# Patient Record
Sex: Male | Born: 1990 | Race: White | Hispanic: No | Marital: Married | Smoking: Never smoker
Health system: Southern US, Community
[De-identification: ages and names within clinical notes are randomized; demographics above are authoritative.]

## PROBLEM LIST (undated history)

## (undated) ENCOUNTER — Emergency Department: Admission: EM | Payer: Medicaid Other | Source: Home / Self Care

---

## 2019-09-18 ENCOUNTER — Emergency Department: Payer: Medicaid Other

## 2019-09-18 ENCOUNTER — Other Ambulatory Visit: Payer: Self-pay

## 2019-09-18 ENCOUNTER — Encounter: Payer: Self-pay | Admitting: *Deleted

## 2019-09-18 ENCOUNTER — Emergency Department
Admission: EM | Admit: 2019-09-18 | Discharge: 2019-09-18 | Disposition: A | Discharge status: Home / Self Care | Payer: Medicaid Other | Attending: Student in an Organized Health Care Education/Training Program | Admitting: Student in an Organized Health Care Education/Training Program

## 2019-09-18 DIAGNOSIS — Z20828 Contact with and (suspected) exposure to other viral communicable diseases: Secondary | ICD-10-CM | POA: Diagnosis not present

## 2019-09-18 DIAGNOSIS — R05 Cough: Secondary | ICD-10-CM | POA: Insufficient documentation

## 2019-09-18 DIAGNOSIS — Z20822 Contact with and (suspected) exposure to covid-19: Secondary | ICD-10-CM

## 2019-09-18 IMAGING — DX DG CHEST 1V
1 series · 1 of 1 positions shown · non-contrast
Comparison: None

CLINICAL DATA: Cough, fever, body aches

EXAM:
CHEST  1 VIEW

[chest ap]
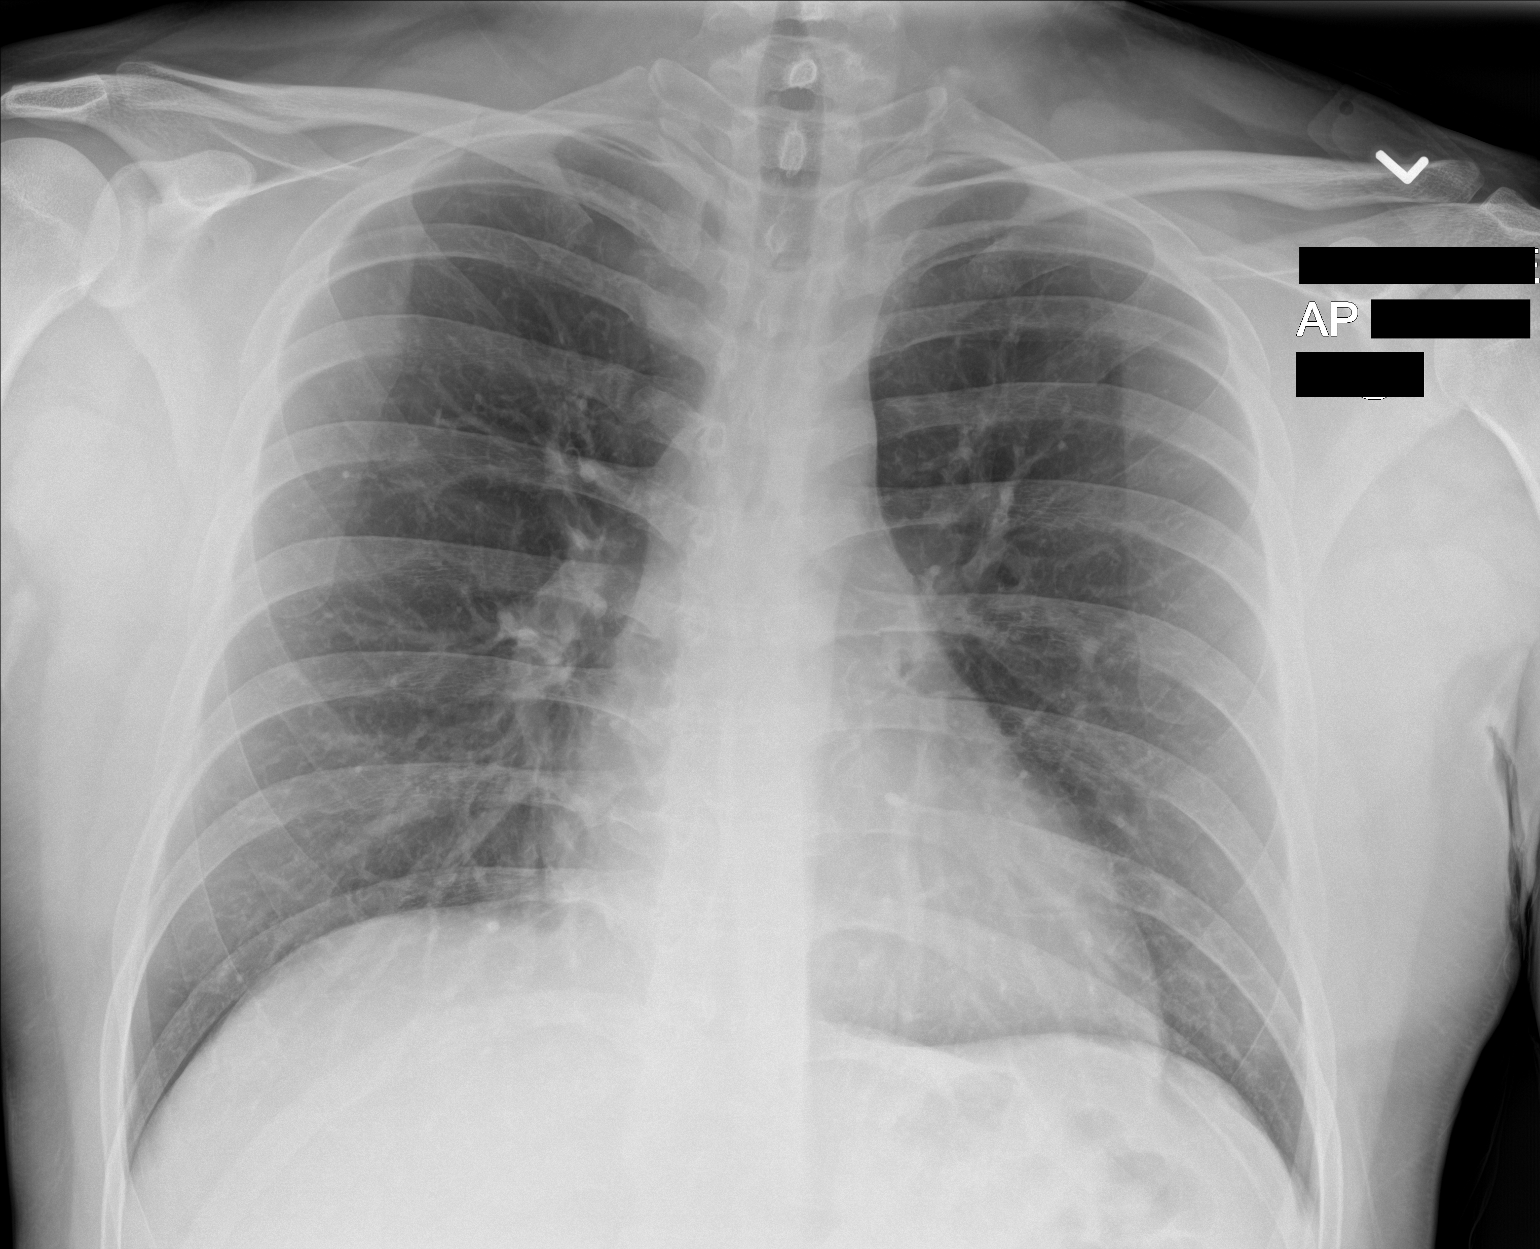

[1 of 1 positions shown; findings below may reference images not displayed]

FINDINGS: No consolidation, features of edema, pneumothorax, or effusion.
Pulmonary vascularity is normally distributed. The cardiomediastinal
contours are unremarkable. No acute osseous or soft tissue
abnormality.
IMPRESSION: No acute cardiopulmonary abnormality.

## 2019-09-18 MED ORDER — PREDNISONE 20 MG PO TABS
60.0000 mg | ORAL_TABLET | Freq: Once | ORAL | Status: AC
  Administered 2019-09-18: 60 mg via ORAL
  Filled 2019-09-18: qty 3

## 2019-09-18 MED ORDER — ALBUTEROL SULFATE HFA 108 (90 BASE) MCG/ACT IN AERS: 2.0000 | INHALATION_SPRAY | RESPIRATORY_TRACT | 0 refills | Status: DC | PRN

## 2019-09-18 MED ORDER — PREDNISONE 50 MG PO TABS: 50.0000 mg | ORAL_TABLET | Freq: Every day | ORAL | 0 refills | Status: DC

## 2019-09-18 MED ORDER — ALBUTEROL SULFATE (2.5 MG/3ML) 0.083% IN NEBU
2.5000 mg | INHALATION_SOLUTION | Freq: Once | RESPIRATORY_TRACT | Status: AC
  Administered 2019-09-18: 2.5 mg via RESPIRATORY_TRACT
  Filled 2019-09-18: qty 3

## 2019-09-18 MED ORDER — HYDROCODONE-HOMATROPINE 5-1.5 MG/5ML PO SYRP: 5.0000 mL | ORAL_SOLUTION | Freq: Four times a day (QID) | ORAL | 0 refills | Status: DC | PRN

## 2019-09-18 MED ORDER — BENZONATATE 100 MG PO CAPS: 100.0000 mg | ORAL_CAPSULE | Freq: Four times a day (QID) | ORAL | 0 refills | Status: DC | PRN

## 2019-09-18 MED ORDER — ONDANSETRON 4 MG PO TBDP: 4.0000 mg | ORAL_TABLET | Freq: Three times a day (TID) | ORAL | 0 refills | Status: DC | PRN

## 2019-09-18 MED ORDER — BENZONATATE 100 MG PO CAPS
100.0000 mg | ORAL_CAPSULE | Freq: Once | ORAL | Status: AC
  Administered 2019-09-18: 100 mg via ORAL
  Filled 2019-09-18: qty 1

## 2019-09-18 NOTE — ED Notes (Signed)
E-signature not working at this time. Pt verbalized understanding of D/C instructions, prescriptions and follow up care with no further questions at this time. Pt in NAD and ambulatory at time of D/C.  

## 2019-09-18 NOTE — ED Provider Notes (Signed)
[facility] Emergency Department Provider Note  ____________________________________________  Time seen: Approximately 10:41 PM  I have reviewed the triage vital signs and the nursing notes.   HISTORY  Chief Complaint Cough    HPI James Salazar is a 28 y.o. male who presents the emergency department complaining of nasal congestion, sore throat, body aches, fever, cough.  Patient reports that he has had symptoms x2 days.  Both of his kids, his wife developed symptoms 4 to 5 days ago.  They have improved but patient is complaining of significant cough.  Patient reports that the cough is worsened with laying down.  Patient does have a sharp pain in the ribs with cough.  No substernal pain.  No abdominal pain.  Patient reports that he will cough to the point of having nausea and "spitting" up.  But no frank emesis.  No diarrhea or constipation.  Over-the-counter measures have not been effective.  Patient denies any known COVID-19 contacts.         No past medical history on file.  There are no active problems to display for this patient.     Prior to Admission medications   Medication Sig Start Date End Date Taking? Authorizing Provider  albuterol (VENTOLIN HFA) 108 (90 Base) MCG/ACT inhaler Inhale 2 puffs into the lungs every 4 (four) hours as needed for wheezing or shortness of breath. 09/18/19   Cuthriell, Delorise Royals, PA-C  benzonatate (TESSALON PERLES) 100 MG capsule Take 1 capsule (100 mg total) by mouth every 6 (six) hours as needed. 09/18/19 09/17/20  Cuthriell, Delorise Royals, PA-C  HYDROcodone-homatropine (HYCODAN) 5-1.5 MG/5ML syrup Take 5 mLs by mouth every 6 (six) hours as needed for cough. 09/18/19   Cuthriell, Delorise Royals, PA-C  ondansetron (ZOFRAN-ODT) 4 MG disintegrating tablet Take 1 tablet (4 mg total) by mouth every 8 (eight) hours as needed for nausea or vomiting. 09/18/19   Cuthriell, Delorise Royals, PA-C  predniSONE (DELTASONE) 50 MG tablet Take 1  tablet (50 mg total) by mouth daily with breakfast. 09/18/19   Cuthriell, Delorise Royals, PA-C    Allergies Patient has no known allergies.  No family history on file.  Social History Social History   Tobacco Use  . Smoking status: Never Smoker  . Smokeless tobacco: Never Used  Substance Use Topics  . Alcohol use: Not Currently  . Drug use: Not Currently     Review of Systems  Constitutional: Positive fever/chills.  Positive for body aches Eyes: No visual changes. No discharge ENT: Positive nasal congestion and sore throat. Cardiovascular: no chest pain. Respiratory: Positive cough.  Shortness of breath with coughing.  Gastrointestinal: No abdominal pain.  No nausea, no vomiting.  No diarrhea.  No constipation. Musculoskeletal: Negative for musculoskeletal pain. Skin: Negative for rash, abrasions, lacerations, ecchymosis. Neurological: Negative for headaches, focal weakness or numbness. 10-point ROS otherwise negative.  ____________________________________________   PHYSICAL EXAM:  VITAL SIGNS: ED Triage Vitals [09/18/19 2231]  Enc Vitals Group     BP (!) 123/91     Pulse Rate 95     Resp 20     Temp 98.9 F (37.2 C)     Temp Source Oral     SpO2 100 %     Weight 187 lb (84.8 kg)     Height 5\' 8"  (1.727 m)     Head Circumference      Peak Flow      Pain Score 0     Pain Loc  Pain Edu?      Excl. in GC?      Constitutional: Alert and oriented. Well appearing and in no acute distress. Eyes: Conjunctivae are normal. PERRL. EOMI. Head: Atraumatic. ENT:      Ears: EACs and TMs unremarkable bilaterally.      Nose: No congestion/rhinnorhea.      Mouth/Throat: Mucous membranes are moist.  Oropharynx is nonerythematous and nonedematous.  Uvula is midline. Neck: No stridor.  Neck is supple full range of motion Hematological/Lymphatic/Immunilogical: No cervical lymphadenopathy. Cardiovascular: Normal rate, regular rhythm. Normal S1 and S2.  Good peripheral  circulation. Respiratory: Normal respiratory effort without tachypnea or retractions. Lungs CTAB. Good air entry to the bases with no decreased or absent breath sounds. Musculoskeletal: Full range of motion to all extremities. No gross deformities appreciated. Neurologic:  Normal speech and language. No gross focal neurologic deficits are appreciated.  Skin:  Skin is warm, dry and intact. No rash noted. Psychiatric: Mood and affect are normal. Speech and behavior are normal. Patient exhibits appropriate insight and judgement.   ____________________________________________   LABS (all labs ordered are listed, but only abnormal results are displayed)  Labs Reviewed  NOVEL CORONAVIRUS, NAA (HOSP ORDER, SEND-OUT TO REF LAB; TAT 18-24 HRS)   ____________________________________________  EKG   ____________________________________________  RADIOLOGY I personally viewed and evaluated these images as part of my medical decision making, as well as reviewing the written report by the radiologist.  Dg Chest 1 View  Result Date: 09/18/2019 CLINICAL DATA:  Cough, fever, body aches EXAM: CHEST  1 VIEW COMPARISON:  None FINDINGS: No consolidation, features of edema, pneumothorax, or effusion. Pulmonary vascularity is normally distributed. The cardiomediastinal contours are unremarkable. No acute osseous or soft tissue abnormality. IMPRESSION: No acute cardiopulmonary abnormality. Electronically Signed   By: Kreg ShropshirePrice  DeHay M.D.   On: 09/18/2019 23:06    ____________________________________________    PROCEDURES  Procedure(s) performed:    Procedures    Medications  benzonatate (TESSALON) capsule 100 mg (100 mg Oral Given 09/18/19 2259)  predniSONE (DELTASONE) tablet 60 mg (60 mg Oral Given 09/18/19 2259)  albuterol (PROVENTIL) (2.5 MG/3ML) 0.083% nebulizer solution 2.5 mg (2.5 mg Inhalation Given 09/18/19 2335)     ____________________________________________   INITIAL IMPRESSION  / ASSESSMENT AND PLAN / ED COURSE  Pertinent labs & imaging results that were available during my care of the patient were reviewed by me and considered in my medical decision making (see chart for details).  Review of the [place] CSRS was performed in accordance of the [facility] prior to dispensing any controlled drugs.        The patient was evaluated for the symptoms described in the history of present illness. The patient was evaluated in the context of the global COVID-19 pandemic, which necessitated consideration that the patient might be at risk for infection with the SARS-CoV-2 virus that causes COVID-19. Institutional protocols and algorithms that pertain to the evaluation of patients at risk for COVID-19 are in a state of rapid change based on information released by regulatory bodies including the CDC and federal and state organizations. The most current policies and algorithms were followed during the patient's care in the ED.    Patient's diagnosis is consistent with suspected COVID 19/bronchitis per patient presents emergency department with cough, shortness of breath, fevers and chills, body aches.  Patient reports that his wife and 2 kids had similar symptoms several days ago and he has developed symptoms over the past 2 days.  Patient was concerned  as he has had 2 episodes where he was admitted for respiratory illnesses.  Vital signs are stable.  Exam is reassuring.  Chest x-ray with no consolidation concerning for pneumonia.  Patient has been swabbed for COVID-19.  In the interim patient will be treated with Tessalon Perles, Vicodin cough syrup, prednisone, albuterol, Zofran.  Patient is stable for discharge.  Medication for further work-up at this time.  Patient is to follow up with primary care as needed or otherwise directed. Patient is given ED precautions to return to the ED for any worsening or new symptoms.     ____________________________________________  FINAL CLINICAL  IMPRESSION(S) / ED DIAGNOSES  Final diagnoses:  Suspected COVID-19 virus infection      NEW MEDICATIONS STARTED DURING THIS VISIT:  ED Discharge Orders         Ordered    HYDROcodone-homatropine (HYCODAN) 5-1.5 MG/5ML syrup  Every 6 hours PRN     09/18/19 2340    benzonatate (TESSALON PERLES) 100 MG capsule  Every 6 hours PRN     09/18/19 2340    predniSONE (DELTASONE) 50 MG tablet  Daily with breakfast     09/18/19 2340    ondansetron (ZOFRAN-ODT) 4 MG disintegrating tablet  Every 8 hours PRN     09/18/19 2340    albuterol (VENTOLIN HFA) 108 (90 Base) MCG/ACT inhaler  Every 4 hours PRN     09/18/19 2340              This chart was dictated using voice recognition software/Dragon. Despite best efforts to proofread, errors can occur which can change the meaning. Any change was purely unintentional.    Darletta Moll, PA-C 09/18/19 2343    Merlyn Lot, MD 09/19/19 [phone]

## 2019-09-18 NOTE — ED Triage Notes (Signed)
Pt reports a cough, runny nose, fever, sore throat.  Sx for 2 days.  Pt alert, speech clear.

## 2019-09-20 LAB — NOVEL CORONAVIRUS, NAA (HOSP ORDER, SEND-OUT TO REF LAB; TAT 18-24 HRS): SARS-CoV-2, NAA: NOT DETECTED

## 2020-03-14 ENCOUNTER — Other Ambulatory Visit: Payer: Self-pay

## 2020-03-14 ENCOUNTER — Observation Stay
Admission: EM | Admit: 2020-03-14 | Discharge: 2020-03-15 | Disposition: A | Discharge status: Home / Self Care | Payer: Medicaid Other | Attending: Surgery | Admitting: Surgery

## 2020-03-14 ENCOUNTER — Emergency Department: Payer: Medicaid Other

## 2020-03-14 ENCOUNTER — Encounter: Payer: Self-pay | Admitting: *Deleted

## 2020-03-14 DIAGNOSIS — R109 Unspecified abdominal pain: Secondary | ICD-10-CM | POA: Diagnosis present

## 2020-03-14 DIAGNOSIS — Z20822 Contact with and (suspected) exposure to covid-19: Secondary | ICD-10-CM | POA: Diagnosis not present

## 2020-03-14 DIAGNOSIS — Z79899 Other long term (current) drug therapy: Secondary | ICD-10-CM | POA: Diagnosis not present

## 2020-03-14 DIAGNOSIS — K3580 Unspecified acute appendicitis: Secondary | ICD-10-CM | POA: Diagnosis not present

## 2020-03-14 DIAGNOSIS — E876 Hypokalemia: Secondary | ICD-10-CM

## 2020-03-14 DIAGNOSIS — F909 Attention-deficit hyperactivity disorder, unspecified type: Secondary | ICD-10-CM | POA: Diagnosis not present

## 2020-03-14 DIAGNOSIS — J45909 Unspecified asthma, uncomplicated: Secondary | ICD-10-CM | POA: Insufficient documentation

## 2020-03-14 DIAGNOSIS — K37 Unspecified appendicitis: Secondary | ICD-10-CM | POA: Diagnosis present

## 2020-03-14 LAB — URINALYSIS, COMPLETE (UACMP) WITH MICROSCOPIC
Bacteria, UA: NONE SEEN
Bilirubin Urine: NEGATIVE
Glucose, UA: NEGATIVE mg/dL
Hgb urine dipstick: NEGATIVE
Ketones, ur: 20 mg/dL — AB
Leukocytes,Ua: NEGATIVE
Nitrite: NEGATIVE
Protein, ur: NEGATIVE mg/dL
Specific Gravity, Urine: 1.018 (ref 1.005–1.030)
Squamous Epithelial / HPF: NONE SEEN (ref 0–5)
WBC, UA: NONE SEEN WBC/hpf (ref 0–5)
pH: 8 (ref 5.0–8.0)

## 2020-03-14 LAB — COMPREHENSIVE METABOLIC PANEL
ALT: 54 U/L — ABNORMAL HIGH (ref 0–44)
AST: 36 U/L (ref 15–41)
Albumin: 4.9 g/dL (ref 3.5–5.0)
Alkaline Phosphatase: 53 U/L (ref 38–126)
Anion gap: 12 (ref 5–15)
BUN: 17 mg/dL (ref 6–20)
CO2: 23 mmol/L (ref 22–32)
Calcium: 9.9 mg/dL (ref 8.9–10.3)
Chloride: 104 mmol/L (ref 98–111)
Creatinine, Ser: 1.01 mg/dL (ref 0.61–1.24)
GFR calc Af Amer: >60 mL/min (ref >60)
GFR calc non Af Amer: >60 mL/min (ref >60)
Glucose, Bld: 142 mg/dL — ABNORMAL HIGH (ref 70–99)
Potassium: 2.9 mmol/L — ABNORMAL LOW (ref 3.5–5.1)
Sodium: 139 mmol/L (ref 135–145)
Total Bilirubin: 0.6 mg/dL (ref 0.3–1.2)
Total Protein: 7.6 g/dL (ref 6.5–8.1)

## 2020-03-14 LAB — CBC
HCT: 40 % (ref 39.0–52.0)
Hemoglobin: 14 g/dL (ref 13.0–17.0)
MCH: 28.5 pg (ref 26.0–34.0)
MCHC: 35 g/dL (ref 30.0–36.0)
MCV: 81.3 fL (ref 80.0–100.0)
Platelets: 356 10*3/uL (ref 150–400)
RBC: 4.92 MIL/uL (ref 4.22–5.81)
RDW: 12.5 % (ref 11.5–15.5)
WBC: 13.2 10*3/uL — ABNORMAL HIGH (ref 4.0–10.5)
nRBC: 0 % (ref 0.0–0.2)

## 2020-03-14 LAB — LIPASE, BLOOD: Lipase: 21 U/L (ref 11–51)

## 2020-03-14 LAB — TROPONIN I (HIGH SENSITIVITY): Troponin I (High Sensitivity): 3 ng/L (ref <18)

## 2020-03-14 MED ORDER — ONDANSETRON HCL 4 MG/2ML IJ SOLN
4.0000 mg | Freq: Once | INTRAMUSCULAR | Status: AC
  Administered 2020-03-14: 22:00:00 4 mg via INTRAVENOUS
  Filled 2020-03-14: qty 2

## 2020-03-14 MED ORDER — IOHEXOL 300 MG/ML  SOLN
100.0000 mL | Freq: Once | INTRAMUSCULAR | Status: AC | PRN
  Administered 2020-03-14: 100 mL via INTRAVENOUS

## 2020-03-14 MED ORDER — KETOROLAC TROMETHAMINE 30 MG/ML IJ SOLN
15.0000 mg | Freq: Once | INTRAMUSCULAR | Status: AC
  Administered 2020-03-14: 15 mg via INTRAVENOUS
  Filled 2020-03-14: qty 1

## 2020-03-14 MED ORDER — SODIUM CHLORIDE 0.9% FLUSH
3.0000 mL | Freq: Once | INTRAVENOUS | Status: AC
  Administered 2020-03-15: 01:00:00 3 mL via INTRAVENOUS

## 2020-03-14 MED ORDER — FENTANYL CITRATE (PF) 100 MCG/2ML IJ SOLN
50.0000 ug | INTRAMUSCULAR | Status: AC | PRN
  Administered 2020-03-14 (×2): 50 ug via INTRAVENOUS
  Filled 2020-03-14 (×2): qty 2

## 2020-03-14 NOTE — ED Notes (Signed)
Pt transported to CT ?

## 2020-03-14 NOTE — ED Triage Notes (Signed)
Pt reports onset of generalized abdominal pain, worse on the right side into the flank area. Has had nausea. Did take some pepto bismol. Denies urinary symptoms. LBM today, reports as normal. Denies fevers.

## 2020-03-15 ENCOUNTER — Observation Stay: Payer: Medicaid Other | Admitting: Anesthesiology

## 2020-03-15 ENCOUNTER — Encounter
Admission: EM | Disposition: A | Discharge status: Home / Self Care | Payer: Self-pay | Source: Home / Self Care | Attending: Emergency Medicine

## 2020-03-15 DIAGNOSIS — K3580 Unspecified acute appendicitis: Secondary | ICD-10-CM | POA: Diagnosis not present

## 2020-03-15 DIAGNOSIS — K37 Unspecified appendicitis: Secondary | ICD-10-CM | POA: Diagnosis present

## 2020-03-15 HISTORY — PX: LAPAROSCOPIC APPENDECTOMY: SHX408

## 2020-03-15 LAB — CREATININE, SERUM
Creatinine, Ser: 0.87 mg/dL (ref 0.61–1.24)
GFR calc Af Amer: >60 mL/min (ref >60)
GFR calc non Af Amer: >60 mL/min (ref >60)

## 2020-03-15 LAB — SURGICAL PCR SCREEN
MRSA, PCR: NEGATIVE
Staphylococcus aureus: POSITIVE — AB

## 2020-03-15 LAB — RESPIRATORY PANEL BY RT PCR (FLU A&B, COVID)
Influenza A by PCR: NEGATIVE
Influenza B by PCR: NEGATIVE
SARS Coronavirus 2 by RT PCR: NEGATIVE

## 2020-03-15 LAB — HIV ANTIBODY (ROUTINE TESTING W REFLEX): HIV Screen 4th Generation wRfx: NONREACTIVE

## 2020-03-15 SURGERY — APPENDECTOMY, LAPAROSCOPIC
Anesthesia: General | Site: Abdomen

## 2020-03-15 MED ORDER — PIPERACILLIN-TAZOBACTAM 3.375 G IVPB: 3.3750 g | Freq: Three times a day (TID) | INTRAVENOUS | Status: DC

## 2020-03-15 MED ORDER — MIDAZOLAM HCL 2 MG/2ML IJ SOLN
INTRAMUSCULAR | Status: DC | PRN
  Administered 2020-03-15: 2 mg via INTRAVENOUS

## 2020-03-15 MED ORDER — AMPHETAMINE-DEXTROAMPHETAMINE 10 MG PO TABS
20.0000 mg | ORAL_TABLET | Freq: Two times a day (BID) | ORAL | Status: DC
  Administered 2020-03-15: 12:00:00 20 mg via ORAL
  Filled 2020-03-15: qty 2

## 2020-03-15 MED ORDER — HEPARIN SODIUM (PORCINE) 5000 UNIT/ML IJ SOLN: 5000.0000 [IU] | Freq: Three times a day (TID) | INTRAMUSCULAR | Status: DC

## 2020-03-15 MED ORDER — MUPIROCIN 2 % EX OINT
1.0000 "application " | TOPICAL_OINTMENT | Freq: Two times a day (BID) | CUTANEOUS | Status: DC
  Filled 2020-03-15: qty 22

## 2020-03-15 MED ORDER — LIDOCAINE HCL (PF) 2 % IJ SOLN
INTRAMUSCULAR | Status: AC
  Filled 2020-03-15: qty 5

## 2020-03-15 MED ORDER — SODIUM CHLORIDE 0.9 % IV SOLN: INTRAVENOUS | Status: DC

## 2020-03-15 MED ORDER — FENTANYL CITRATE (PF) 100 MCG/2ML IJ SOLN
INTRAMUSCULAR | Status: DC | PRN
  Administered 2020-03-15: 50 ug via INTRAVENOUS
  Administered 2020-03-15: 100 ug via INTRAVENOUS

## 2020-03-15 MED ORDER — ONDANSETRON HCL 4 MG/2ML IJ SOLN
INTRAMUSCULAR | Status: AC
  Filled 2020-03-15: qty 2

## 2020-03-15 MED ORDER — FENTANYL CITRATE (PF) 100 MCG/2ML IJ SOLN
25.0000 ug | INTRAMUSCULAR | Status: DC | PRN
  Administered 2020-03-15 (×2): 50 ug via INTRAVENOUS

## 2020-03-15 MED ORDER — LACTATED RINGERS IV BOLUS
1000.0000 mL | Freq: Once | INTRAVENOUS | Status: AC
  Administered 2020-03-15: 01:00:00 1000 mL via INTRAVENOUS

## 2020-03-15 MED ORDER — OXYCODONE HCL 5 MG PO TABS: 5.0000 mg | ORAL_TABLET | Freq: Once | ORAL | Status: DC | PRN

## 2020-03-15 MED ORDER — OXYCODONE HCL 5 MG PO TABS
5.0000 mg | ORAL_TABLET | ORAL | Status: DC | PRN
  Administered 2020-03-15: 13:00:00 10 mg via ORAL
  Filled 2020-03-15: qty 2

## 2020-03-15 MED ORDER — FENTANYL CITRATE (PF) 100 MCG/2ML IJ SOLN
INTRAMUSCULAR | Status: AC
  Filled 2020-03-15: qty 2

## 2020-03-15 MED ORDER — PIPERACILLIN-TAZOBACTAM 3.375 G IVPB
3.3750 g | Freq: Three times a day (TID) | INTRAVENOUS | Status: DC
  Administered 2020-03-15: 08:00:00 3.375 g via INTRAVENOUS
  Filled 2020-03-15: qty 50

## 2020-03-15 MED ORDER — DIPHENHYDRAMINE HCL 12.5 MG/5ML PO ELIX
12.5000 mg | ORAL_SOLUTION | Freq: Four times a day (QID) | ORAL | Status: DC | PRN
  Filled 2020-03-15: qty 5

## 2020-03-15 MED ORDER — ROCURONIUM BROMIDE 100 MG/10ML IV SOLN
INTRAVENOUS | Status: DC | PRN
  Administered 2020-03-15: 35 mg via INTRAVENOUS

## 2020-03-15 MED ORDER — MORPHINE SULFATE (PF) 2 MG/ML IV SOLN
2.0000 mg | INTRAVENOUS | Status: DC | PRN
  Administered 2020-03-15 (×2): 2 mg via INTRAVENOUS
  Filled 2020-03-15 (×2): qty 1

## 2020-03-15 MED ORDER — OXYCODONE HCL 5 MG/5ML PO SOLN: 5.0000 mg | Freq: Once | ORAL | Status: DC | PRN

## 2020-03-15 MED ORDER — PROPOFOL 10 MG/ML IV BOLUS
INTRAVENOUS | Status: DC | PRN
  Administered 2020-03-15: 200 mg via INTRAVENOUS

## 2020-03-15 MED ORDER — PANTOPRAZOLE SODIUM 40 MG PO TBEC
40.0000 mg | DELAYED_RELEASE_TABLET | Freq: Every day | ORAL | Status: DC
  Administered 2020-03-15: 12:00:00 40 mg via ORAL
  Filled 2020-03-15: qty 1

## 2020-03-15 MED ORDER — KETOROLAC TROMETHAMINE 30 MG/ML IJ SOLN
30.0000 mg | Freq: Four times a day (QID) | INTRAMUSCULAR | Status: DC | PRN
  Administered 2020-03-15: 08:00:00 30 mg via INTRAVENOUS

## 2020-03-15 MED ORDER — PIPERACILLIN-TAZOBACTAM 3.375 G IVPB 30 MIN
3.3750 g | Freq: Once | INTRAVENOUS | Status: AC
  Administered 2020-03-15: 01:00:00 3.375 g via INTRAVENOUS
  Filled 2020-03-15: qty 50

## 2020-03-15 MED ORDER — ONDANSETRON HCL 4 MG/2ML IJ SOLN: 4.0000 mg | Freq: Once | INTRAMUSCULAR | Status: DC | PRN

## 2020-03-15 MED ORDER — POTASSIUM CHLORIDE 10 MEQ/100ML IV SOLN: 10.0000 meq | INTRAVENOUS | Status: AC

## 2020-03-15 MED ORDER — SUCCINYLCHOLINE CHLORIDE 200 MG/10ML IV SOSY
PREFILLED_SYRINGE | INTRAVENOUS | Status: AC
  Filled 2020-03-15: qty 10

## 2020-03-15 MED ORDER — ACETAMINOPHEN 10 MG/ML IV SOLN
INTRAVENOUS | Status: DC | PRN
  Administered 2020-03-15: 1000 mg via INTRAVENOUS

## 2020-03-15 MED ORDER — DIPHENHYDRAMINE HCL 50 MG/ML IJ SOLN: 12.5000 mg | Freq: Four times a day (QID) | INTRAMUSCULAR | Status: DC | PRN

## 2020-03-15 MED ORDER — ACETAMINOPHEN 10 MG/ML IV SOLN: 1000.0000 mg | Freq: Once | INTRAVENOUS | Status: DC | PRN

## 2020-03-15 MED ORDER — POTASSIUM CHLORIDE 10 MEQ/100ML IV SOLN
10.0000 meq | INTRAVENOUS | Status: AC
  Administered 2020-03-15 (×3): 10 meq via INTRAVENOUS
  Filled 2020-03-15 (×2): qty 100

## 2020-03-15 MED ORDER — ACETAMINOPHEN 10 MG/ML IV SOLN
INTRAVENOUS | Status: AC
  Filled 2020-03-15: qty 100

## 2020-03-15 MED ORDER — OXYCODONE HCL 5 MG PO TABS: 5.0000 mg | ORAL_TABLET | ORAL | 0 refills | Status: AC | PRN

## 2020-03-15 MED ORDER — HYDRALAZINE HCL 20 MG/ML IJ SOLN: 10.0000 mg | INTRAMUSCULAR | Status: DC | PRN

## 2020-03-15 MED ORDER — KETOROLAC TROMETHAMINE 30 MG/ML IJ SOLN
INTRAMUSCULAR | Status: AC
  Filled 2020-03-15: qty 1

## 2020-03-15 MED ORDER — ROCURONIUM BROMIDE 10 MG/ML (PF) SYRINGE
PREFILLED_SYRINGE | INTRAVENOUS | Status: AC
  Filled 2020-03-15: qty 10

## 2020-03-15 MED ORDER — PROPOFOL 10 MG/ML IV BOLUS
INTRAVENOUS | Status: AC
  Filled 2020-03-15: qty 40

## 2020-03-15 MED ORDER — LACTATED RINGERS IV SOLN: INTRAVENOUS | Status: DC | PRN

## 2020-03-15 MED ORDER — DEXAMETHASONE SODIUM PHOSPHATE 10 MG/ML IJ SOLN
INTRAMUSCULAR | Status: DC | PRN
  Administered 2020-03-15: 10 mg via INTRAVENOUS

## 2020-03-15 MED ORDER — LIDOCAINE HCL (CARDIAC) PF 100 MG/5ML IV SOSY
PREFILLED_SYRINGE | INTRAVENOUS | Status: DC | PRN
  Administered 2020-03-15: 100 mg via INTRAVENOUS

## 2020-03-15 MED ORDER — MIDAZOLAM HCL 2 MG/2ML IJ SOLN
INTRAMUSCULAR | Status: AC
  Filled 2020-03-15: qty 2

## 2020-03-15 MED ORDER — ONDANSETRON 4 MG PO TBDP: 4.0000 mg | ORAL_TABLET | Freq: Four times a day (QID) | ORAL | Status: DC | PRN

## 2020-03-15 MED ORDER — BUPIVACAINE-EPINEPHRINE 0.25% -1:200000 IJ SOLN
INTRAMUSCULAR | Status: DC | PRN
  Administered 2020-03-15: 30 mL

## 2020-03-15 MED ORDER — ONDANSETRON HCL 4 MG/2ML IJ SOLN
4.0000 mg | Freq: Four times a day (QID) | INTRAMUSCULAR | Status: DC | PRN
  Administered 2020-03-15: 4 mg via INTRAVENOUS

## 2020-03-15 MED ORDER — KETOROLAC TROMETHAMINE 30 MG/ML IJ SOLN
30.0000 mg | Freq: Four times a day (QID) | INTRAMUSCULAR | Status: DC
  Administered 2020-03-15: 30 mg via INTRAVENOUS
  Filled 2020-03-15: qty 1

## 2020-03-15 MED ORDER — POTASSIUM CHLORIDE 10 MEQ/100ML IV SOLN
10.0000 meq | INTRAVENOUS | Status: AC
  Administered 2020-03-15: 01:00:00 10 meq via INTRAVENOUS
  Filled 2020-03-15 (×2): qty 100

## 2020-03-15 MED ORDER — DEXAMETHASONE SODIUM PHOSPHATE 10 MG/ML IJ SOLN
INTRAMUSCULAR | Status: AC
  Filled 2020-03-15: qty 1

## 2020-03-15 SURGICAL SUPPLY — 39 items
APPLICATOR COTTON TIP 6 STRL (MISCELLANEOUS) ×1 IMPLANT
APPLICATOR COTTON TIP 6IN STRL (MISCELLANEOUS) ×3
APPLIER CLIP 5 13 M/L LIGAMAX5 (MISCELLANEOUS) ×3
BLADE CLIPPER SURG (BLADE) ×3 IMPLANT
CANISTER SUCT 1200ML W/VALVE (MISCELLANEOUS) ×3 IMPLANT
CHLORAPREP W/TINT 26 (MISCELLANEOUS) ×3 IMPLANT
CLIP APPLIE 5 13 M/L LIGAMAX5 (MISCELLANEOUS) ×1 IMPLANT
COVER WAND RF STERILE (DRAPES) ×3 IMPLANT
CUTTER FLEX LINEAR 45M (STAPLE) ×3 IMPLANT
DERMABOND ADVANCED (GAUZE/BANDAGES/DRESSINGS) ×2
DERMABOND ADVANCED .7 DNX12 (GAUZE/BANDAGES/DRESSINGS) ×1 IMPLANT
ELECT CAUTERY BLADE 6.4 (BLADE) ×3 IMPLANT
ELECT REM PT RETURN 9FT ADLT (ELECTROSURGICAL) ×3
ELECTRODE REM PT RTRN 9FT ADLT (ELECTROSURGICAL) ×1 IMPLANT
GLOVE BIO SURGEON STRL SZ7 (GLOVE) ×6 IMPLANT
GOWN STRL REUS W/ TWL LRG LVL3 (GOWN DISPOSABLE) ×3 IMPLANT
GOWN STRL REUS W/TWL LRG LVL3 (GOWN DISPOSABLE) ×6
IRRIGATION STRYKERFLOW (MISCELLANEOUS) ×1 IMPLANT
IRRIGATOR STRYKERFLOW (MISCELLANEOUS) ×3
IV NS 1000ML (IV SOLUTION) ×2
IV NS 1000ML BAXH (IV SOLUTION) ×1 IMPLANT
NEEDLE HYPO 22GX1.5 SAFETY (NEEDLE) ×3 IMPLANT
NS IRRIG 500ML POUR BTL (IV SOLUTION) ×3 IMPLANT
PACK LAP CHOLECYSTECTOMY (MISCELLANEOUS) ×3 IMPLANT
PENCIL ELECTRO HAND CTR (MISCELLANEOUS) ×3 IMPLANT
POUCH SPECIMEN RETRIEVAL 10MM (ENDOMECHANICALS) ×3 IMPLANT
RELOAD 45 VASCULAR/THIN (ENDOMECHANICALS) ×3 IMPLANT
RELOAD STAPLE TA45 3.5 REG BLU (ENDOMECHANICALS) ×3 IMPLANT
SCISSORS METZENBAUM CVD 33 (INSTRUMENTS) IMPLANT
SHEARS HARMONIC ACE PLUS 36CM (ENDOMECHANICALS) ×3 IMPLANT
SLEEVE ENDOPATH XCEL 5M (ENDOMECHANICALS) ×3 IMPLANT
SPONGE LAP 18X18 RF (DISPOSABLE) ×3 IMPLANT
SUT MNCRL AB 4-0 PS2 18 (SUTURE) ×3 IMPLANT
SUT VICRYL 0 AB UR-6 (SUTURE) ×6 IMPLANT
SYR 20ML LL LF (SYRINGE) ×3 IMPLANT
TRAY FOLEY MTR SLVR 16FR STAT (SET/KITS/TRAYS/PACK) IMPLANT
TROCAR XCEL BLUNT TIP 100MML (ENDOMECHANICALS) ×3 IMPLANT
TROCAR XCEL NON-BLD 5MMX100MML (ENDOMECHANICALS) ×6 IMPLANT
TUBING EVAC SMOKE HEATED PNEUM (TUBING) ×3 IMPLANT

## 2020-03-15 NOTE — Op Note (Signed)
laparascopic appendectomy   James Salazar Date of operation:  03/15/2020  Indications: The patient presented with a history of  abdominal pain. Workup has revealed findings consistent with acute appendicitis.  Pre-operative Diagnosis: Acute appendicitis without mention of peritonitis  Post-operative Diagnosis: Same  Surgeon: Sterling Big, MD, FACS  Anesthesia: General with endotracheal tube  Findings: Acute non perforated appendicitis  Estimated Blood Loss: 5cc         Specimens: appendix         Complications:  none  Procedure Details  The patient was seen again in the preop area. The options of surgery versus observation were reviewed with the patient and/or family. The risks of bleeding, infection, recurrence of symptoms, negative laparoscopy, potential for an open procedure, bowel injury, abscess or infection, were all reviewed as well. The patient was taken to Operating Room, identified as James Salazar and the procedure verified as laparoscopic appendectomy. A Time Out was held and the above information confirmed.  The patient was placed in the supine position and general anesthesia was induced.  Antibiotic prophylaxis was administered and VT E prophylaxis was in place. A Foley catheter was placed by the nursing staff.   The abdomen was prepped and draped in a sterile fashion. An infraumbilical incision was made. A cutdown technique was used to enter the abdominal cavity. Two vicryl stitches were placed on the fascia and a Hasson trocar inserted. Pneumoperitoneum obtained. Two 5 mm ports were placed under direct visualization.   The appendix was identified and found to be acutely inflamed  The appendix was carefully dissected. The mesoappendix was divided withHarmonic scalpel. The base of the appendix was dissected out and divided with a standard load Endo GIA.The appendix was placed in a Endo Catch bag and removed via the Hasson port. The right lower quadrant and pelvis was  then irrigated with  normal saline which was aspirated. Inspection  failed to identify any additional bleeding and there were no signs of bowel injury. Again the right lower quadrant was inspected there was no sign of bleeding or bowel injury therefore pneumoperitoneum was released, all ports were removed.  The umbilical fascia was closed with 0 Vicryl interrupted sutures and the skin incisions were approximated with subcuticular 4-0 Monocryl. Dermabond was placed The patient tolerated the procedure well, there were no complications. The sponge lap and needle count were correct at the end of the procedure.  The patient was taken to the recovery room in stable condition to be admitted for continued care.    Sterling Big, MD FACS

## 2020-03-15 NOTE — Anesthesia Procedure Notes (Signed)
Procedure Name: Intubation Date/Time: 03/15/2020 7:53 AM Performed by: Estanislado Emms, CRNA Pre-anesthesia Checklist: Patient identified, Patient being monitored, Timeout performed, Emergency Drugs available and Suction available Patient Re-evaluated:Patient Re-evaluated prior to induction Oxygen Delivery Method: Circle system utilized Preoxygenation: Pre-oxygenation with 100% oxygen Induction Type: IV induction Ventilation: Mask ventilation without difficulty Laryngoscope Size: Miller and 2 Grade View: Grade I Tube type: Oral Tube size: 7.5 mm Number of attempts: 1 Airway Equipment and Method: Stylet Placement Confirmation: ETT inserted through vocal cords under direct vision,  positive ETCO2 and breath sounds checked- equal and bilateral Secured at: 21 cm Tube secured with: Tape Dental Injury: Teeth and Oropharynx as per pre-operative assessment

## 2020-03-15 NOTE — Discharge Instructions (Signed)
Laparoscopic Appendectomy, Adult, Care After This sheet gives you information about how to care for yourself after your procedure. Your health care provider may also give you more specific instructions. If you have problems or questions, contact your health care provider. What can I expect after the procedure? After the procedure, it is common to have:  Little energy for normal activities.  Mild pain in the area where the incisions were made.  Difficulty passing stool (constipation). This can be caused by: ? Pain medicine. ? A decrease in your activity. Follow these instructions at home: Medicines  Take over-the-counter and prescription medicines only as told by your health care provider.  If you were prescribed an antibiotic medicine, take it as told by your health care provider. Do not stop taking the antibiotic even if you start to feel better.  Do not drive or use heavy machinery while taking prescription pain medicine.  Ask your health care provider if the medicine prescribed to you can cause constipation. You may need to take steps to prevent or treat constipation, such as: ? Drink enough fluid to keep your urine pale yellow. ? Take over-the-counter or prescription medicines. ? Eat foods that are high in fiber, such as beans, whole grains, and fresh fruits and vegetables. ? Limit foods that are high in fat and processed sugars, such as fried or sweet foods. Incision care   Follow instructions from your health care provider about how to take care of your incisions. Make sure you: ? Wash your hands with soap and water before and after you change your bandage (dressing). If soap and water are not available, use hand sanitizer. ? Change your dressing as told by your health care provider. ? Leave stitches (sutures), skin glue, or adhesive strips in place. These skin closures may need to stay in place for 2 weeks or longer. If adhesive strip edges start to loosen and curl up, you may  trim the loose edges. Do not remove adhesive strips completely unless your health care provider tells you to do that.  Check your incision areas every day for signs of infection. Check for: ? Redness, swelling, or pain. ? Fluid or blood. ? Warmth. ? Pus or a bad smell. Bathing  Keep your incisions clean and dry. Clean them as often as told by your health care provider. To do this: 1. Gently wash the incisions with soap and water. 2. Rinse the incisions with water to remove all soap. 3. Pat the incisions dry with a clean towel. Do not rub the incisions.  Do not take baths, swim, or use a hot tub for 2 weeks, or until your health care provider approves. You may take showers after 48 hours. Activity   Do not drive for 24 hours if you were given a sedative during your procedure.  Rest after the procedure. Return to your normal activities as told by your health care provider. Ask your health care provider what activities are safe for you.  For 3 weeks, or for as long as told by your health care provider: ? Do not lift anything that is heavier than 10 lb (4.5 kg), or the limit that you are told. ? Do not play contact sports. General instructions  If you were sent home with a drain, follow instructions from your health care provider about how to care for it.  Take deep breaths. This helps to prevent your lungs from developing an infection (pneumonia).  Keep all follow-up visits as told by your health   care provider. This is important. Contact a health care provider if:  You have redness, swelling, or pain around an incision.  You have fluid or blood coming from an incision.  Your incision feels warm to the touch.  You have pus or a bad smell coming from an incision or dressing.  Your incision edges break open after your sutures have been removed.  You have increasing pain in your shoulders.  You feel dizzy or you faint.  You develop shortness of breath.  You keep feeling  nauseous or you are vomiting.  You have diarrhea or you cannot control your bowel functions.  You lose your appetite.  You develop swelling or pain in your legs.  You develop a rash. Get help right away if you have:  A fever.  Difficulty breathing.  Sharp pains in your chest. Summary  After a laparoscopic appendectomy, it is common to have little energy for normal activities, mild pain in the area of the incisions, and constipation.  Infection is the most common complication after this procedure. Follow your health care provider's instructions about caring for yourself after the procedure.  Rest after the procedure. Return to your normal activities as told by your health care provider.  Contact your health care provider if you notice signs of infection around your incisions or you develop shortness of breath. Get help right away if you have a fever, chest pain, or difficulty breathing. This information is not intended to replace advice given to you by your health care provider. Make sure you discuss any questions you have with your health care provider. Document Revised: 05/25/2018 Document Reviewed: 05/25/2018 Elsevier Patient Education  2020 [employer]  

## 2020-03-15 NOTE — Anesthesia Preprocedure Evaluation (Signed)
Anesthesia Evaluation  Patient identified by MRN, date of birth, ID band Patient awake    Reviewed: Allergy & Precautions, NPO status , Patient's Chart, lab work & pertinent test results  History of Anesthesia Complications Negative for: history of anesthetic complications  Airway Mallampati: I  TM Distance: >3 FB Neck ROM: Full    Dental no notable dental hx. (+) Teeth Intact, Dental Advisory Given   Pulmonary neg pulmonary ROS, neg sleep apnea, neg COPD, Patient abstained from smoking.Not current smoker,    Pulmonary exam normal breath sounds clear to auscultation       Cardiovascular Exercise Tolerance: Good METS(-) hypertension(-) CAD and (-) Past MI negative cardio ROS  (-) dysrhythmias  Rhythm:Regular Rate:Normal - Systolic murmurs    Neuro/Psych negative neurological ROS  negative psych ROS   GI/Hepatic neg GERD  ,(+)     (-) substance abuse  ,   Endo/Other  neg diabetes  Renal/GU negative Renal ROS     Musculoskeletal   Abdominal (+)  Abdomen: soft and tender.    Peds  Hematology   Anesthesia Other Findings History reviewed. No pertinent past medical history.  Reproductive/Obstetrics                             Anesthesia Physical Anesthesia Plan  ASA: II  Anesthesia Plan: General   Post-op Pain Management:    Induction: Intravenous and Rapid sequence  PONV Risk Score and Plan: 3 and Ondansetron, Dexamethasone and Midazolam  Airway Management Planned: Oral ETT  Additional Equipment: None  Intra-op Plan:   Post-operative Plan: Extubation in OR  Informed Consent: I have reviewed the patients History and Physical, chart, labs and discussed the procedure including the risks, benefits and alternatives for the proposed anesthesia with the patient or authorized representative who has indicated his/her understanding and acceptance.     Dental advisory given  Plan  Discussed with: CRNA and Surgeon  Anesthesia Plan Comments: (Discussed risks of anesthesia with patient, including PONV, sore throat, lip/dental damage. Rare risks discussed as well, such as cardiorespiratory and neurological sequelae. Patient understands.  Pt denies N/V. Majority of symptoms of his appendicitis was abdominal pain.)        Anesthesia Quick Evaluation

## 2020-03-15 NOTE — Discharge Summary (Signed)
  Patient ID: James Salazar MRN: [id number] DOB/AGE: November 27, 1991 29 y.o.  Admit date: 03/14/2020 Discharge date: 03/15/2020   Discharge Diagnoses:  Active Problems:   Appendicitis   Procedures: lap appendectomy   Hospital Course:  29 yo maleadmitted with findings consistent with acute appendicitis and  was taken promptly to the operating room for an uneventful laparoscopic Appendectomy.  Patient was observed on the floor.At  The time of discharge the patient was ambulating,  pain was controlled.  His vital signs were stable and she was afebrile.   physical exam at discharge showed a pt  in no acute distress.  Awake and alert.  Abdomen: Soft incisions healing well without infection or peritonitis.  Extremities well-perfused and no edema.  Condition of the patient the time of discharge was stable     Disposition: Discharge disposition: 01-Home or Self Care       Discharge Instructions    Call MD for:  difficulty breathing, headache or visual disturbances   Complete by: As directed    Call MD for:  extreme fatigue   Complete by: As directed    Call MD for:  hives   Complete by: As directed    Call MD for:  persistant dizziness or light-headedness   Complete by: As directed    Call MD for:  persistant nausea and vomiting   Complete by: As directed    Call MD for:  redness, tenderness, or signs of infection (pain, swelling, redness, odor or green/yellow discharge around incision site)   Complete by: As directed    Call MD for:  severe uncontrolled pain   Complete by: As directed    Call MD for:  temperature >100.4   Complete by: As directed    Diet - low sodium heart healthy   Complete by: As directed    Discharge instructions   Complete by: As directed    Shower Monday am   Increase activity slowly   Complete by: As directed    Lifting restrictions   Complete by: As directed    20 lbs x 6 wks     Allergies as of 03/15/2020   No Known Allergies     Medication List     TAKE these medications   amphetamine-dextroamphetamine 10 MG tablet Commonly known as: ADDERALL Take 10 mg by mouth 2 (two) times daily.   amphetamine-dextroamphetamine 20 MG tablet Commonly known as: ADDERALL Take 20 mg by mouth 2 (two) times daily.   esomeprazole 40 MG capsule Commonly known as: NEXIUM Take 40 mg by mouth daily.   meloxicam 15 MG tablet Commonly known as: MOBIC Take 15 mg by mouth daily.   oxyCODONE 5 MG immediate release tablet Commonly known as: Oxy IR/ROXICODONE Take 1 tablet (5 mg total) by mouth every 4 (four) hours as needed for severe pain.      Follow-up Information    Cassity Christian, [place] F, MD Follow up in 2 week(s).   Specialty: General Surgery Why: May do virtual visit Contact information: [address] [place] [address] [phone]            Sterling Big, MD FACS

## 2020-03-15 NOTE — Anesthesia Postprocedure Evaluation (Signed)
Anesthesia Post Note  Patient: James Salazar  Procedure(s) Performed: APPENDECTOMY LAPAROSCOPIC (N/A Abdomen)  Patient location during evaluation: PACU Anesthesia Type: General Level of consciousness: awake and alert Pain management: pain level controlled Vital Signs Assessment: post-procedure vital signs reviewed and stable Respiratory status: spontaneous breathing, nonlabored ventilation, respiratory function stable and patient connected to nasal cannula oxygen Cardiovascular status: blood pressure returned to baseline and stable Postop Assessment: no apparent nausea or vomiting Anesthetic complications: no     Last Vitals:  Vitals:   03/15/20 0935 03/15/20 0942  BP:    Pulse: 74   Resp: 14   Temp:    SpO2: 99% 96%    Last Pain:  Vitals:   03/15/20 0944  TempSrc:   PainSc: 6                  Corinda Gubler

## 2020-03-15 NOTE — Transfer of Care (Signed)
Immediate Anesthesia Transfer of Care Note  Patient: James Salazar  Procedure(s) Performed: APPENDECTOMY LAPAROSCOPIC (N/A Abdomen)  Patient Location: PACU and [name] Stay  Anesthesia Type:General  Level of Consciousness: drowsy and responds to stimulation  Airway & Oxygen Therapy: Patient Spontanous Breathing and Patient connected to nasal cannula oxygen  Post-op Assessment: Report given to RN and Post -op Vital signs reviewed and stable  Post vital signs: Reviewed and stable  Last Vitals:  Vitals Value Taken Time  BP 133/76 03/15/20 0846  Temp 36.3 C 03/15/20 0846  Pulse 74 03/15/20 0846  Resp 12 03/15/20 0846  SpO2 100 % 03/15/20 0846    Last Pain:  Vitals:   03/15/20 0846  TempSrc:   PainSc: Asleep         Complications: No apparent anesthesia complications

## 2020-03-15 NOTE — ED Provider Notes (Addendum)
[facility] Emergency Department Provider Note  ____________________________________________  Time seen: Approximately 12:05 AM  I have reviewed the triage vital signs and the nursing notes.   HISTORY  Chief Complaint Abdominal Pain   HPI James Salazar is a 29 y.o. male with h/o asthma who presents for evaluation of abdominal pain.  Patient reports the pain started dull around 5 PM and has gotten progressively worse.  The pain is located periumbilically and on the right side of his abdomen.  Currently 10 out of 10 associated with nausea but no vomiting.  No dysuria or hematuria, no constipation or diarrhea, no chest pain or shortness of breath.  No prior abdominal surgeries.   PMH ADHD Migraine Asthma  Prior to Admission medications   Medication Sig Start Date End Date Taking? Authorizing Provider  amphetamine-dextroamphetamine (ADDERALL) 20 MG tablet Take 20 mg by mouth 3 (three) times daily. 01/28/20   [provider]  esomeprazole (NEXIUM) 40 MG capsule Take 40 mg by mouth daily. 11/26/19   [provider]  gabapentin (NEURONTIN) 300 MG capsule Take 300 mg by mouth daily. 01/28/20   [provider]    Allergies Patient has no known allergies.  No family history on file.  Social History Social History   Tobacco Use  . Smoking status: Never Smoker  . Smokeless tobacco: Never Used  Substance Use Topics  . Alcohol use: Not Currently  . Drug use: Not Currently    Review of Systems  Constitutional: Negative for fever. Eyes: Negative for visual changes. ENT: Negative for sore throat. Neck: No neck pain  Cardiovascular: Negative for chest pain. Respiratory: Negative for shortness of breath. Gastrointestinal: + R sided abdominal pain and nausea. No vomiting or diarrhea. Genitourinary: Negative for dysuria. Musculoskeletal: Negative for back pain. Skin: Negative for rash. Neurological: Negative for headaches,  weakness or numbness. Psych: No SI or HI  ____________________________________________   PHYSICAL EXAM:  VITAL SIGNS: ED Triage Vitals  Enc Vitals Group     BP 03/14/20 2037 (!) 162/78     Pulse Rate 03/14/20 2037 (!) 106     Resp 03/14/20 2037 20     Temp 03/14/20 2037 97.6 F (36.4 C)     Temp Source 03/14/20 2037 Oral     SpO2 03/14/20 2037 100 %     Weight --      Height --      Head Circumference --      Peak Flow --      Pain Score 03/14/20 2042 10     Pain Loc --      Pain Edu? --      Excl. in GC? --     Constitutional: Alert and oriented. Well appearing and in no apparent distress. HEENT:      Head: Normocephalic and atraumatic.         Eyes: Conjunctivae are normal. Sclera is non-icteric.       Mouth/Throat: Mucous membranes are moist.       Neck: Supple with no signs of meningismus. Cardiovascular: Regular rate and rhythm. No murmurs, gallops, or rubs. 2+ symmetrical distal pulses are present in all extremities. No JVD. Respiratory: Normal respiratory effort. Lungs are Salazar to auscultation bilaterally. No wheezes, crackles, or rhonchi.  Gastrointestinal: Soft, tender to palpation diffusely worse in the right lower quadrant with no rebound or guarding Musculoskeletal: No edema, cyanosis, or erythema of extremities. Neurologic: Normal speech and language. Face is symmetric. Moving all extremities. No gross  focal neurologic deficits are appreciated. Skin: Skin is warm, dry and intact. No rash noted. Psychiatric: Mood and affect are normal. Speech and behavior are normal.  ____________________________________________   LABS (all labs ordered are listed, but only abnormal results are displayed)  Labs Reviewed  COMPREHENSIVE METABOLIC PANEL - Abnormal; Notable for the following components:      Result Value   Potassium 2.9 (*)    Glucose, Bld 142 (*)    ALT 54 (*)    All other components within normal limits  CBC - Abnormal; Notable for the following  components:   WBC 13.2 (*)    All other components within normal limits  URINALYSIS, COMPLETE (UACMP) WITH MICROSCOPIC - Abnormal; Notable for the following components:   Color, Urine YELLOW (*)    APPearance TURBID (*)    Ketones, ur 20 (*)    All other components within normal limits  RESPIRATORY PANEL BY RT PCR (FLU A&B, COVID)  LIPASE, BLOOD  TROPONIN I (HIGH SENSITIVITY)   ____________________________________________  EKG  ED ECG REPORT I, Rudene Re, the attending physician, personally viewed and interpreted this ECG.  Sinus tachycardia, rate of 101, normal intervals, RAD, no ST elevations or depressions. ____________________________________________  RADIOLOGY  I have personally reviewed the images performed during this visit and I agree with the Radiologist's read.   Interpretation by Radiologist:  CT ABDOMEN PELVIS W CONTRAST  Result Date: 03/14/2020 CLINICAL DATA:  Abdominal pain. EXAM: CT ABDOMEN AND PELVIS WITH CONTRAST TECHNIQUE: Multidetector CT imaging of the abdomen and pelvis was performed using the standard protocol following bolus administration of intravenous contrast. CONTRAST:  121mL OMNIPAQUE IOHEXOL 300 MG/ML  SOLN COMPARISON:  None. FINDINGS: Lower chest: No acute abnormality. Hepatobiliary: No focal liver abnormality is seen. No gallstones, gallbladder wall thickening, or biliary dilatation. Pancreas: Unremarkable. No pancreatic ductal dilatation or surrounding inflammatory changes. Spleen: Normal in size without focal abnormality. Adrenals/Urinary Tract: Adrenal glands are unremarkable. Kidneys are normal, without renal calculi, focal lesion, or hydronephrosis. Bladder is unremarkable. Stomach/Bowel: Stomach is within normal limits. The appendix is dilated (9 mm) and fluid-filled a mild amount of distal periappendiceal fluid and periappendiceal inflammatory fat stranding is noted. No appendiceal wall thickening is seen. No evidence of bowel dilatation.  Vascular/Lymphatic: No significant vascular findings are present. No enlarged abdominal or pelvic lymph nodes. Reproductive: Prostate is unremarkable. Other: No abdominal wall hernia or abnormality. No abdominopelvic ascites. Musculoskeletal: No acute or significant osseous findings. IMPRESSION: Findings consistent with acute uncomplicated appendicitis. Electronically Signed   By: Virgina Norfolk M.D.   On: 03/14/2020 23:51     ____________________________________________   PROCEDURES  Procedure(s) performed:yes .1-3 Lead EKG Interpretation Performed by: Rudene Re, MD Authorized by: Rudene Re, MD     Interpretation: normal     ECG rate:  100   ECG rate assessment: tachycardic     Rhythm: sinus tachycardia     Ectopy: none     Conduction: normal     Critical Care performed: None ____________________________________________   INITIAL IMPRESSION / ASSESSMENT AND PLAN / ED COURSE   29 y.o. male with h/o asthma who presents for evaluation of abdominal pain.  Patient is well-appearing no distress, slightly tachycardic but afebrile, abdomen is diffusely tender to palpation worse on the right quadrants with no rebound or guarding.  Labs ordered showing leukocytosis with white count of 13.2.  Mild hypokalemia with K of 2.9 but no EKG changes.  There was supplemented IV.  Patient received IV fentanyl, Toradol and Zofran  for symptom relief.  Received IV fluids for hydration.  CT abdomen pelvis visualized and interpreted by me as acute uncomplicated appendicitis.  Read was confirmed by radiology.  Patient was started on IV zosyn.  Surgeon on-call was consulted and Dr. Everlene Farrier accepted patient to his service. Diagnosis and plan discussed with patient and his wife.       _____________________________________________ Please note:  Patient was evaluated in Emergency Department today for the symptoms described in the history of present illness. Patient was evaluated in the context  of the global COVID-19 pandemic, which necessitated consideration that the patient might be at risk for infection with the SARS-CoV-2 virus that causes COVID-19. Institutional protocols and algorithms that pertain to the evaluation of patients at risk for COVID-19 are in a state of rapid change based on information released by regulatory bodies including the CDC and federal and state organizations. These policies and algorithms were followed during the patient's care in the ED.  Some ED evaluations and interventions may be delayed as a result of limited staffing during the pandemic.   [place] Controlled Substance Database was reviewed by me. ____________________________________________   FINAL CLINICAL IMPRESSION(S) / ED DIAGNOSES   Final diagnoses:  Acute appendicitis, uncomplicated  Hypokalemia      NEW MEDICATIONS STARTED DURING THIS VISIT:  ED Discharge Orders    None       Note:  This document was prepared using Dragon voice recognition software and may include unintentional dictation errors.    Don Perking, [place], MD 03/15/20 Janeann Merl, [place], MD 03/15/20 [phone]

## 2020-03-15 NOTE — Plan of Care (Signed)
Discharge order received. Patient mental status is at baseline. Vital signs stable . No signs of acute distress. Discharge instructions given. Patient verbalized understanding. No other issues noted at this time.   

## 2020-03-15 NOTE — H&P (Signed)
Patient ID: James Salazar, male   DOB: 07-Jul-1991, 29 y.o.   MRN: [id number]  HPI James W Coone is a 29 y.o. male seen at the request of Dr. Rexene Edison SA and I have discussed the case with her.  He came in with a 2-day history of right lower quadrant abdominal pain.  The patient reports that the pain is intermittent initially in the periumbilical area and now is located to the right side of his abdomen.  It is sharp and worsening with certain movements.  No fevers no chills.  He does report decreased appetite and nausea but no vomiting.  No sick contacts.  Is able to perform more than 4 METS of activity without any shortness of breath or chest pain.  No previous abdominal operations.  CT scan performed personally reviewed there is evidence of appendicitis.  The appendix sits more overnight superior and retrocecal fashion and laterally.  No evidence of perforation no evidence of an abscess.  White count is slightly elevated to 13,000 CMP is normal except some mild hypokalemia.  HPI  History reviewed. No pertinent past medical history.  History reviewed. No pertinent surgical history.   No pert. fam hx  Social History Social History   Tobacco Use  . Smoking status: Never Smoker  . Smokeless tobacco: Never Used  Substance Use Topics  . Alcohol use: Not Currently  . Drug use: Not Currently    No Known Allergies  Current Facility-Administered Medications  Medication Dose Route Frequency Provider Last Rate Last Admin  . lactated ringers bolus 1,000 mL  1,000 mL Intravenous Once Alfred Levins, [place], MD      . piperacillin-tazobactam (ZOSYN) IVPB 3.375 g  3.375 g Intravenous Once Alfred Levins, [place], MD      . potassium chloride 10 mEq in 100 mL IVPB  10 mEq Intravenous Q1 Hr x 2 Alfred Levins, [place], MD      . sodium chloride flush (NS) 0.9 % injection 3 mL  3 mL Intravenous Once Rudene Re, MD       Current Outpatient Medications  Medication Sig Dispense Refill  .  amphetamine-dextroamphetamine (ADDERALL) 20 MG tablet Take 20 mg by mouth 3 (three) times daily.    Marland Kitchen esomeprazole (NEXIUM) 40 MG capsule Take 40 mg by mouth daily.    Marland Kitchen gabapentin (NEURONTIN) 300 MG capsule Take 300 mg by mouth daily.       Review of Systems Full ROS  was asked and was negative except for the information on the HPI  Physical Exam Blood pressure 136/76, pulse 90, temperature (!) 97.5 F (36.4 C), temperature source Oral, resp. rate 13, SpO2 100 %. CONSTITUTIONAL: NAD EYES: Pupils are equal, round, and reactive to light, Sclera are non-icteric. EARS, NOSE, MOUTH AND THROAT: The oropharynx is clear. The oral mucosa is pink and moist. Hearing is intact to voice. LYMPH NODES:  Lymph nodes in the neck are normal. RESPIRATORY:  Lungs are clear. There is normal respiratory effort, with equal breath sounds bilaterally, and without pathologic use of accessory muscles. CARDIOVASCULAR: Heart is regular without murmurs, gallops, or rubs. GI: The abdomen is  soft, tender RLQ w focal peritonitis. There are no palpable masses. There is no hepatosplenomegaly. There are normal bowel sounds in all quadrants. GU: Rectal deferred.   MUSCULOSKELETAL: Normal muscle strength and tone. No cyanosis or edema.   SKIN: Turgor is good and there are no pathologic skin lesions or ulcers. NEUROLOGIC: Motor and sensation is grossly normal. Cranial nerves are grossly intact. PSYCH:  Oriented to person, place and time. Affect is normal.  Data Reviewed  I have personally reviewed the patient's imaging, laboratory findings and medical records.    Assessment/Plan 29 yo w signs and sxs c/w appendicitis The risks, benefits, complications, treatment options, and expected outcomes were discussed with the patient.y.  Also discussed continuing to the operating room for Laparoscopic Appendectomy.  The possibilities of  bleeding, recurrent infection, perforation of viscus, finding a normal appendix, the need for  additional procedures, failure to diagnose a condition, conversion to open procedure and creating a complication requiring transfusion or further operations were discussed. The patient was given the opportunity to ask questions and have them answered.  Patient would like to proceed with Laparoscopic Appendectomy and consent was obtained. We will start fluids, a/bs and after completed covid testing and pending OR availability we will proceed.  We will also start replacing potassium  Sterling Big, MD FACS General Surgeon 03/15/2020, 12:44 AM

## 2020-03-18 LAB — SURGICAL PATHOLOGY

## 2020-03-31 ENCOUNTER — Telehealth (INDEPENDENT_AMBULATORY_CARE_PROVIDER_SITE_OTHER): Payer: Medicaid Other | Admitting: Surgery

## 2020-03-31 ENCOUNTER — Other Ambulatory Visit: Payer: Self-pay

## 2020-03-31 DIAGNOSIS — Z09 Encounter for follow-up examination after completed treatment for conditions other than malignant neoplasm: Secondary | ICD-10-CM

## 2020-03-31 NOTE — Progress Notes (Signed)
James Salazar is a 29 year old male status post appendectomy Doing well.  I was able to connect with him via MyChart video conference He is doing well but has some incisional pain especially in the epigastric port.  No fevers no chills is taking p.o.   I was able to visualize his incisions and they are healing well without evidence of infection. path discussed with him in detail  A/p Doing very well He is very appreciative RTC prn No heavy lifting

## 2021-06-03 ENCOUNTER — Other Ambulatory Visit
Admission: RE | Admit: 2021-06-03 | Discharge: 2021-06-03 | Disposition: A | Discharge status: Home / Self Care | Payer: Medicaid Other | Source: Ambulatory Visit | Attending: Pulmonary Disease | Admitting: Pulmonary Disease

## 2021-06-03 DIAGNOSIS — R0600 Dyspnea, unspecified: Secondary | ICD-10-CM | POA: Diagnosis present

## 2021-06-03 LAB — D-DIMER, QUANTITATIVE: D-Dimer, Quant: <0.27 ug/mL-FEU (ref 0.00–0.50)

## 2021-06-04 ENCOUNTER — Other Ambulatory Visit: Payer: Self-pay | Admitting: Pulmonary Disease

## 2021-06-04 ENCOUNTER — Other Ambulatory Visit (HOSPITAL_COMMUNITY): Payer: Self-pay | Admitting: Pulmonary Disease

## 2021-06-04 DIAGNOSIS — R0600 Dyspnea, unspecified: Secondary | ICD-10-CM

## 2021-06-12 ENCOUNTER — Other Ambulatory Visit: Payer: Medicaid Other

## 2021-06-18 ENCOUNTER — Other Ambulatory Visit: Payer: Self-pay | Admitting: Pulmonary Disease

## 2021-06-18 ENCOUNTER — Ambulatory Visit
Admission: RE | Admit: 2021-06-18 | Discharge: 2021-06-18 | Disposition: A | Discharge status: Home / Self Care | Payer: Medicaid Other | Source: Ambulatory Visit | Attending: Pulmonary Disease | Admitting: Pulmonary Disease

## 2021-06-18 ENCOUNTER — Other Ambulatory Visit: Payer: Self-pay

## 2021-06-18 DIAGNOSIS — R0600 Dyspnea, unspecified: Secondary | ICD-10-CM | POA: Diagnosis not present

## 2021-07-10 ENCOUNTER — Other Ambulatory Visit: Payer: Self-pay

## 2021-07-10 ENCOUNTER — Emergency Department
Admission: EM | Admit: 2021-07-10 | Discharge: 2021-07-10 | Disposition: A | Discharge status: Home / Self Care | Payer: Medicaid Other | Attending: Emergency Medicine | Admitting: Emergency Medicine

## 2021-07-10 DIAGNOSIS — R531 Weakness: Secondary | ICD-10-CM | POA: Insufficient documentation

## 2021-07-10 DIAGNOSIS — R61 Generalized hyperhidrosis: Secondary | ICD-10-CM | POA: Diagnosis not present

## 2021-07-10 DIAGNOSIS — R55 Syncope and collapse: Secondary | ICD-10-CM | POA: Insufficient documentation

## 2021-07-10 DIAGNOSIS — R197 Diarrhea, unspecified: Secondary | ICD-10-CM | POA: Diagnosis not present

## 2021-07-10 DIAGNOSIS — R0789 Other chest pain: Secondary | ICD-10-CM | POA: Insufficient documentation

## 2021-07-10 DIAGNOSIS — R0602 Shortness of breath: Secondary | ICD-10-CM | POA: Insufficient documentation

## 2021-07-10 DIAGNOSIS — R112 Nausea with vomiting, unspecified: Secondary | ICD-10-CM | POA: Insufficient documentation

## 2021-07-10 LAB — LIPASE, BLOOD: Lipase: 30 U/L (ref 11–51)

## 2021-07-10 LAB — COMPREHENSIVE METABOLIC PANEL WITH GFR
ALT: 32 U/L (ref 0–44)
AST: 25 U/L (ref 15–41)
Albumin: 4.5 g/dL (ref 3.5–5.0)
Alkaline Phosphatase: 65 U/L (ref 38–126)
Anion gap: 8 (ref 5–15)
BUN: 18 mg/dL (ref 6–20)
CO2: 26 mmol/L (ref 22–32)
Calcium: 9.2 mg/dL (ref 8.9–10.3)
Chloride: 104 mmol/L (ref 98–111)
Creatinine, Ser: 1.13 mg/dL (ref 0.61–1.24)
GFR, Estimated: >60 mL/min
Glucose, Bld: 125 mg/dL — ABNORMAL HIGH (ref 70–99)
Potassium: 4 mmol/L (ref 3.5–5.1)
Sodium: 138 mmol/L (ref 135–145)
Total Bilirubin: 0.9 mg/dL (ref 0.3–1.2)
Total Protein: 7.6 g/dL (ref 6.5–8.1)

## 2021-07-10 LAB — CBC
HCT: 44.1 % (ref 39.0–52.0)
Hemoglobin: 15.3 g/dL (ref 13.0–17.0)
MCH: 29.5 pg (ref 26.0–34.0)
MCHC: 34.7 g/dL (ref 30.0–36.0)
MCV: 85 fL (ref 80.0–100.0)
Platelets: 333 10*3/uL (ref 150–400)
RBC: 5.19 MIL/uL (ref 4.22–5.81)
RDW: 12.3 % (ref 11.5–15.5)
WBC: 12 10*3/uL — ABNORMAL HIGH (ref 4.0–10.5)
nRBC: 0 % (ref 0.0–0.2)

## 2021-07-10 LAB — TROPONIN I (HIGH SENSITIVITY)
Troponin I (High Sensitivity): <2 ng/L (ref <18)
Troponin I (High Sensitivity): <2 ng/L

## 2021-07-10 MED ORDER — ONDANSETRON 4 MG PO TBDP: 4.0000 mg | ORAL_TABLET | Freq: Three times a day (TID) | ORAL | 0 refills | Status: AC | PRN

## 2021-07-10 MED ORDER — ONDANSETRON 4 MG PO TBDP: 4.0000 mg | ORAL_TABLET | Freq: Three times a day (TID) | ORAL | 0 refills | Status: DC | PRN

## 2021-07-10 MED ORDER — ONDANSETRON HCL 4 MG/2ML IJ SOLN
4.0000 mg | Freq: Once | INTRAMUSCULAR | Status: AC
  Administered 2021-07-10: 4 mg via INTRAVENOUS
  Filled 2021-07-10: qty 2

## 2021-07-10 MED ORDER — LACTATED RINGERS IV BOLUS
1000.0000 mL | Freq: Once | INTRAVENOUS | Status: AC
  Administered 2021-07-10: 1000 mL via INTRAVENOUS

## 2021-07-10 MED ORDER — ONDANSETRON 4 MG PO TBDP: 4.0000 mg | ORAL_TABLET | Freq: Once | ORAL | Status: DC

## 2021-07-10 NOTE — ED Notes (Signed)
Pt able to ambulate around the room without dizziness, lightheadedness or becoming pale. Pt reports feeling safe to go home and rest. MD made aware.

## 2021-07-10 NOTE — ED Provider Notes (Signed)
[facility] Emergency Department Provider Note  ____________________________________________   Event Date/Time   First MD Initiated Contact with Patient 07/10/21 [phone]     (approximate)  I have reviewed the triage vital signs and the nursing notes.   HISTORY  Chief Complaint Loss of Consciousness    HPI James Salazar is a 30 y.o. male here with near syncopal episode.  The patient states that earlier tonight, he woke up and was feeling very nauseous.  He went to the bathroom and had multiple episodes of emesis as well as loose, watery, diarrhea.  Shortly thereafter, he became very sweaty, cold, and felt like he was going to pass out.  His wife checked on him and states that he was minimally responsive and seemed out of it.  He was very pale.  His symptoms persisted for at least several minutes.  He states he continues to feel generally weak now.  He has had 1 additional episode in which she began to feel pale and was noted to be diaphoretic with nursing.  Denies any chest pain.  Denies any recent medication changes.  Of note, he has had recent increase in shortness of breath and intermittent chest discomfort episodes, for which she has been undergoing fairly extensive cardiac and pulmonary work-up.  He just had an echocardiogram.  He does not know the results.    No past medical history on file.  Patient Active Problem List   Diagnosis Date Noted   Appendicitis 03/15/2020    Past Surgical History:  Procedure Laterality Date   LAPAROSCOPIC APPENDECTOMY N/A 03/15/2020   Procedure: APPENDECTOMY LAPAROSCOPIC;  Surgeon: Leafy Ro, MD;  Location: [facility] ORS;  Service: General;  Laterality: N/A;    Prior to Admission medications   Medication Sig Start Date End Date Taking? Authorizing Provider  amphetamine-dextroamphetamine (ADDERALL) 10 MG tablet Take 10 mg by mouth 2 (two) times daily. 05/02/19 05/01/20  [provider]   amphetamine-dextroamphetamine (ADDERALL) 20 MG tablet Take 20 mg by mouth 2 (two) times daily.  01/28/20   [provider]  esomeprazole (NEXIUM) 40 MG capsule Take 40 mg by mouth daily. 11/26/19   [provider]  meloxicam (MOBIC) 15 MG tablet Take 15 mg by mouth daily. 12/31/19   [provider]  ondansetron (ZOFRAN ODT) 4 MG disintegrating tablet Take 1 tablet (4 mg total) by mouth every 8 (eight) hours as needed for nausea or vomiting. 07/10/21   Shaune Pollack, MD  oxyCODONE (OXY IR/ROXICODONE) 5 MG immediate release tablet Take 1 tablet (5 mg total) by mouth every 4 (four) hours as needed for severe pain. 03/15/20   Pabon, Merri Ray, MD    Allergies Patient has no known allergies.  No family history on file.  Social History Social History   Tobacco Use   Smoking status: Never   Smokeless tobacco: Never  Substance Use Topics   Alcohol use: Not Currently   Drug use: Not Currently    Review of Systems  Review of Systems  Constitutional:  Positive for fatigue. Negative for chills and fever.  HENT:  Negative for sore throat.   Respiratory:  Negative for shortness of breath.   Cardiovascular:  Positive for chest pain.  Gastrointestinal:  Positive for diarrhea, nausea and vomiting. Negative for abdominal pain.  Genitourinary:  Negative for flank pain.  Musculoskeletal:  Negative for neck pain.  Skin:  Negative for rash and wound.  Allergic/Immunologic: Negative for immunocompromised state.  Neurological:  Positive for weakness  and light-headedness. Negative for numbness.  Hematological:  Does not bruise/bleed easily.  All other systems reviewed and are negative.   ____________________________________________  PHYSICAL EXAM:      VITAL SIGNS: ED Triage Vitals  Enc Vitals Group     BP 07/10/21 0346 104/67     Pulse Rate 07/10/21 0346 81     Resp 07/10/21 0346 20     Temp 07/10/21 0346 98.5 F (36.9 C)     Temp Source 07/10/21 0346 Oral     SpO2  07/10/21 0346 99 %     Weight 07/10/21 0347 190 lb (86.2 kg)     Height 07/10/21 0347 5\' 8"  (1.727 m)     Head Circumference --      Peak Flow --      Pain Score 07/10/21 0347 0     Pain Loc --      Pain Edu? --      Excl. in GC? --      Physical Exam Vitals and nursing note reviewed.  Constitutional:      General: He is not in acute distress.    Appearance: He is well-developed.  HENT:     Head: Normocephalic and atraumatic.     Mouth/Throat:     Mouth: Mucous membranes are dry.  Eyes:     Conjunctiva/sclera: Conjunctivae normal.  Cardiovascular:     Rate and Rhythm: Normal rate and regular rhythm.     Heart sounds: Normal heart sounds. No murmur heard.   No friction rub.  Pulmonary:     Effort: Pulmonary effort is normal. No respiratory distress.     Breath sounds: Normal breath sounds. No wheezing or rales.  Abdominal:     General: There is no distension.     Palpations: Abdomen is soft.     Tenderness: There is no abdominal tenderness.  Musculoskeletal:     Cervical back: Neck supple.  Skin:    General: Skin is warm.     Capillary Refill: Capillary refill takes less than 2 seconds.  Neurological:     Mental Status: He is alert and oriented to person, place, and time.     Motor: No abnormal muscle tone.      ____________________________________________   LABS (all labs ordered are listed, but only abnormal results are displayed)  Labs Reviewed  CBC - Abnormal; Notable for the following components:      Result Value   WBC 12.0 (*)    All other components within normal limits  COMPREHENSIVE METABOLIC PANEL - Abnormal; Notable for the following components:   Glucose, Bld 125 (*)    All other components within normal limits  LIPASE, BLOOD  TROPONIN I (HIGH SENSITIVITY)  TROPONIN I (HIGH SENSITIVITY)    ____________________________________________  EKG: Normal sinus rhythm, VR 83. PR 148, QRS 86, QTc 432. No acute ST elevations or depressions.  Nonspecific TWA.  ________________________________________  RADIOLOGY All imaging, including plain films, CT scans, and ultrasounds, independently reviewed by me, and interpretations confirmed via formal radiology reads.  ED MD interpretation:     Official radiology report(s): No results found.  ____________________________________________  PROCEDURES   Procedure(s) performed (including Critical Care):  Procedures  ____________________________________________  INITIAL IMPRESSION / MDM / ASSESSMENT AND PLAN / ED COURSE  As part of my medical decision making, I reviewed the following data within the electronic MEDICAL RECORD NUMBER Nursing notes reviewed and incorporated, Old chart reviewed, Notes from prior ED visits, and [place] Controlled Substance Database       *  James Salazar was evaluated in Emergency Department on 07/10/2021 for the symptoms described in the history of present illness. He was evaluated in the context of the global COVID-19 pandemic, which necessitated consideration that the patient might be at risk for infection with the SARS-CoV-2 virus that causes COVID-19. Institutional protocols and algorithms that pertain to the evaluation of patients at risk for COVID-19 are in a state of rapid change based on information released by regulatory bodies including the CDC and federal and state organizations. These policies and algorithms were followed during the patient's care in the ED.  Some ED evaluations and interventions may be delayed as a result of limited staffing during the pandemic.*     Medical Decision Making:  30 yo M here with near syncope in setting of n/v/d episode. Suspect vasovagal syncope, as pt had acute onset of nausea, vomiting, diarrhea followed by vagal prodrome and symptoms. He did not fully lose consciousness. No CP, SOB, or palpitations. EKG is nonischemic, with no apparent arrhythmia and pt was monitored on telemetry without ectopy or abnormality. Trop  negative, do not suspect ACS. No s/s PE. Electrolytes wnl. CBC without anemia or significant abnormality. Given prodrome c/w vasovagal syncope episode, feel it's reasonable to manage as an outpt. Re: his n/v/d, ddx includes viral GI illness, food borne illness. Abdomen is soft and NT. He is tolerating PO. Pt able to ambulate in ED w/o difficulty and remains well appearing. Will trial zofran PRN nausea, advise outpt follow-up.  ____________________________________________  FINAL CLINICAL IMPRESSION(S) / ED DIAGNOSES  Final diagnoses:  Vasovagal syncope     MEDICATIONS GIVEN DURING THIS VISIT:  Medications  lactated ringers bolus 1,000 mL (0 mLs Intravenous Stopped 07/10/21 0539)  ondansetron (ZOFRAN) injection 4 mg (4 mg Intravenous Given 07/10/21 0422)     ED Discharge Orders          Ordered    ondansetron (ZOFRAN ODT) 4 MG disintegrating tablet  Every 8 hours PRN,   Status:  Discontinued        07/10/21 0641    ondansetron (ZOFRAN ODT) 4 MG disintegrating tablet  Every 8 hours PRN        07/10/21 3710             Note:  This document was prepared using Dragon voice recognition software and may include unintentional dictation errors.   Shaune Pollack, MD 07/10/21 [phone]

## 2021-07-10 NOTE — ED Notes (Signed)
MD at bedside. 

## 2021-07-10 NOTE — ED Triage Notes (Signed)
Pt brought in by [facility] pt states started having general malaise yesterday. Woke up tonight with dizziness and diarrhea. States also co nausea, tonight states had syncopal episode when walking to bathroom. Denies any injuries from the fall.

## 2022-02-04 ENCOUNTER — Other Ambulatory Visit: Payer: Self-pay | Admitting: Pulmonary Disease

## 2022-02-04 DIAGNOSIS — R053 Chronic cough: Secondary | ICD-10-CM

## 2022-06-30 ENCOUNTER — Ambulatory Visit: Payer: Medicaid Other | Attending: Pulmonary Disease

## 2022-06-30 DIAGNOSIS — R053 Chronic cough: Secondary | ICD-10-CM | POA: Diagnosis present

## 2022-06-30 MED ORDER — SODIUM CHLORIDE 0.9 % IN NEBU
3.0000 mL | INHALATION_SOLUTION | Freq: Once | RESPIRATORY_TRACT | Status: AC
  Administered 2022-06-30: 3 mL via RESPIRATORY_TRACT

## 2022-06-30 MED ORDER — METHACHOLINE 16 MG/ML NEB SOLN
3.0000 mL | Freq: Once | RESPIRATORY_TRACT | Status: AC
  Administered 2022-06-30: 48 mg via RESPIRATORY_TRACT
  Filled 2022-06-30: qty 8

## 2022-06-30 MED ORDER — ALBUTEROL SULFATE (2.5 MG/3ML) 0.083% IN NEBU
2.5000 mg | INHALATION_SOLUTION | Freq: Once | RESPIRATORY_TRACT | Status: AC
  Administered 2022-06-30: 2.5 mg via RESPIRATORY_TRACT
  Filled 2022-06-30: qty 3

## 2022-06-30 MED ORDER — METHACHOLINE 0.0625 MG/ML NEB SOLN
3.0000 mL | Freq: Once | RESPIRATORY_TRACT | Status: AC
  Administered 2022-06-30: 0.1875 mg via RESPIRATORY_TRACT
  Filled 2022-06-30: qty 8

## 2022-06-30 MED ORDER — METHACHOLINE 0.25 MG/ML NEB SOLN
3.0000 mL | Freq: Once | RESPIRATORY_TRACT | Status: AC
  Administered 2022-06-30: 0.75 mg via RESPIRATORY_TRACT
  Filled 2022-06-30: qty 8

## 2022-06-30 MED ORDER — METHACHOLINE 1 MG/ML NEB SOLN
3.0000 mL | Freq: Once | RESPIRATORY_TRACT | Status: AC
  Administered 2022-06-30: 3 mg via RESPIRATORY_TRACT
  Filled 2022-06-30: qty 8

## 2022-06-30 MED ORDER — METHACHOLINE 4 MG/ML NEB SOLN
3.0000 mL | Freq: Once | RESPIRATORY_TRACT | Status: AC
  Administered 2022-06-30: 12 mg via RESPIRATORY_TRACT
  Filled 2022-06-30: qty 8

## 2022-07-01 LAB — PULMONARY FUNCTION TEST ARMC ONLY
FEF 25-75 Post: 4.36 L/sec
FEF 25-75 Pre: 4.16 L/sec
FEF2575-%Change-Post: 4 %
FEF2575-%Pred-Post: 102 %
FEF2575-%Pred-Pre: 98 %
FEV1-%Change-Post: 1 %
FEV1-%Pred-Post: 106 %
FEV1-%Pred-Pre: 104 %
FEV1-Post: 4.45 L
FEV1-Pre: 4.37 L
FEV1FVC-%Change-Post: 2 %
FEV1FVC-%Pred-Pre: 97 %
FEV6-%Change-Post: 0 %
FEV6-%Pred-Post: 107 %
FEV6-%Pred-Pre: 107 %
FEV6-Post: 5.43 L
FEV6-Pre: 5.45 L
FEV6FVC-%Change-Post: 0 %
FEV6FVC-%Pred-Post: 101 %
FEV6FVC-%Pred-Pre: 100 %
FVC-%Change-Post: 0 %
FVC-%Pred-Post: 106 %
FVC-%Pred-Pre: 106 %
FVC-Post: 5.44 L
FVC-Pre: 5.48 L
Post FEV1/FVC ratio: 82 %
Post FEV6/FVC ratio: 100 %
Pre FEV1/FVC ratio: 80 %
Pre FEV6/FVC Ratio: 100 %
